# Patient Record
Sex: Female | Born: 1990 | Race: White | Hispanic: No | Marital: Single | Smoking: Current every day smoker
Health system: Southern US, Community
[De-identification: ages and names within clinical notes are randomized; demographics above are authoritative.]

---
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
## 2012-11-28 ENCOUNTER — Emergency Department (HOSPITAL_COMMUNITY): Payer: No Typology Code available for payment source

## 2012-11-28 ENCOUNTER — Emergency Department (HOSPITAL_COMMUNITY)
Admission: EM | Admit: 2012-11-28 | Discharge: 2012-11-28 | Disposition: A | Discharge status: Home / Self Care | Payer: No Typology Code available for payment source | Attending: Emergency Medicine | Admitting: Emergency Medicine

## 2012-11-28 ENCOUNTER — Encounter (HOSPITAL_COMMUNITY): Payer: Self-pay | Admitting: Emergency Medicine

## 2012-11-28 DIAGNOSIS — Z87828 Personal history of other (healed) physical injury and trauma: Secondary | ICD-10-CM | POA: Insufficient documentation

## 2012-11-28 DIAGNOSIS — T733XXA Exhaustion due to excessive exertion, initial encounter: Secondary | ICD-10-CM | POA: Insufficient documentation

## 2012-11-28 DIAGNOSIS — S335XXA Sprain of ligaments of lumbar spine, initial encounter: Secondary | ICD-10-CM | POA: Insufficient documentation

## 2012-11-28 DIAGNOSIS — F17200 Nicotine dependence, unspecified, uncomplicated: Secondary | ICD-10-CM | POA: Insufficient documentation

## 2012-11-28 DIAGNOSIS — Y9229 Other specified public building as the place of occurrence of the external cause: Secondary | ICD-10-CM | POA: Insufficient documentation

## 2012-11-28 DIAGNOSIS — Y9389 Activity, other specified: Secondary | ICD-10-CM | POA: Insufficient documentation

## 2012-11-28 DIAGNOSIS — S39012A Strain of muscle, fascia and tendon of lower back, initial encounter: Secondary | ICD-10-CM

## 2012-11-28 MED ORDER — CYCLOBENZAPRINE HCL 10 MG PO TABS: 10.0000 mg | ORAL_TABLET | Freq: Three times a day (TID) | ORAL | Status: AC | PRN

## 2012-11-28 MED ORDER — HYDROCODONE-ACETAMINOPHEN 5-325 MG PO TABS: 1.0000 | ORAL_TABLET | Freq: Four times a day (QID) | ORAL | Status: AC | PRN

## 2012-11-28 MED ORDER — PREDNISONE 50 MG PO TABS: 50.0000 mg | ORAL_TABLET | Freq: Every day | ORAL | Status: AC
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
## 2012-11-30 NOTE — ED Provider Notes (Signed)
Medical screening examination/treatment/procedure(s) were performed by non-physician practitioner and as supervising physician I was immediately available for consultation/collaboration.   Rolan Bucco, MD 11/30/12 [phone]

## 2013-01-02 ENCOUNTER — Ambulatory Visit: Payer: No Typology Code available for payment source | Attending: Orthopedic Surgery | Admitting: Physical Therapy

## 2013-01-02 DIAGNOSIS — IMO0001 Reserved for inherently not codable concepts without codable children: Secondary | ICD-10-CM | POA: Insufficient documentation

## 2013-01-02 DIAGNOSIS — M542 Cervicalgia: Secondary | ICD-10-CM | POA: Insufficient documentation

## 2013-01-02 DIAGNOSIS — M545 Low back pain, unspecified: Secondary | ICD-10-CM | POA: Insufficient documentation

## 2013-01-02 DIAGNOSIS — M546 Pain in thoracic spine: Secondary | ICD-10-CM | POA: Insufficient documentation

## 2013-01-06 ENCOUNTER — Ambulatory Visit: Payer: No Typology Code available for payment source | Attending: Orthopedic Surgery | Admitting: Physical Therapy

## 2013-01-06 DIAGNOSIS — M545 Low back pain, unspecified: Secondary | ICD-10-CM | POA: Insufficient documentation

## 2013-01-06 DIAGNOSIS — IMO0001 Reserved for inherently not codable concepts without codable children: Secondary | ICD-10-CM | POA: Insufficient documentation

## 2013-01-06 DIAGNOSIS — M546 Pain in thoracic spine: Secondary | ICD-10-CM | POA: Insufficient documentation

## 2013-01-06 DIAGNOSIS — M542 Cervicalgia: Secondary | ICD-10-CM | POA: Insufficient documentation

## 2013-01-08 ENCOUNTER — Ambulatory Visit: Payer: No Typology Code available for payment source | Admitting: Physical Therapy

## 2013-01-28 ENCOUNTER — Ambulatory Visit: Payer: No Typology Code available for payment source | Admitting: Physical Therapy

## 2013-01-30 ENCOUNTER — Ambulatory Visit: Payer: No Typology Code available for payment source | Admitting: Physical Therapy

## 2014-09-16 ENCOUNTER — Encounter (HOSPITAL_COMMUNITY): Payer: Self-pay | Admitting: Emergency Medicine

## 2014-09-16 ENCOUNTER — Emergency Department (HOSPITAL_COMMUNITY)
Admission: EM | Admit: 2014-09-16 | Discharge: 2014-09-16 | Disposition: A | Discharge status: Home / Self Care | Payer: No Typology Code available for payment source | Attending: Emergency Medicine | Admitting: Emergency Medicine

## 2014-09-16 DIAGNOSIS — Y92410 Unspecified street and highway as the place of occurrence of the external cause: Secondary | ICD-10-CM | POA: Insufficient documentation

## 2014-09-16 DIAGNOSIS — Y998 Other external cause status: Secondary | ICD-10-CM | POA: Insufficient documentation

## 2014-09-16 DIAGNOSIS — Z7952 Long term (current) use of systemic steroids: Secondary | ICD-10-CM | POA: Insufficient documentation

## 2014-09-16 DIAGNOSIS — Z720 Tobacco use: Secondary | ICD-10-CM | POA: Insufficient documentation

## 2014-09-16 DIAGNOSIS — Z79899 Other long term (current) drug therapy: Secondary | ICD-10-CM | POA: Insufficient documentation

## 2014-09-16 DIAGNOSIS — S299XXA Unspecified injury of thorax, initial encounter: Secondary | ICD-10-CM | POA: Insufficient documentation

## 2014-09-16 DIAGNOSIS — R0781 Pleurodynia: Secondary | ICD-10-CM

## 2014-09-16 DIAGNOSIS — Y9389 Activity, other specified: Secondary | ICD-10-CM | POA: Insufficient documentation

## 2014-09-16 MED ORDER — IBUPROFEN 600 MG PO TABS: 600.0000 mg | ORAL_TABLET | Freq: Four times a day (QID) | ORAL | Status: AC | PRN

## 2014-09-16 MED ORDER — TRAMADOL HCL 50 MG PO TABS: 50.0000 mg | ORAL_TABLET | Freq: Four times a day (QID) | ORAL | Status: AC | PRN
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
## 2014-09-16 NOTE — Discharge Instructions (Signed)
Chest Wall Pain °Chest wall pain is pain in or around the bones and muscles of your chest. It may take up to 6 weeks to get better. It may take longer if you must stay physically active in your work and activities.  °CAUSES  °Chest wall pain may happen on its own. However, it may be caused by: °· A viral illness like the flu. °· Injury. °· Coughing. °· Exercise. °· Arthritis. °· Fibromyalgia. °· Shingles. °HOME CARE INSTRUCTIONS  °· Avoid overtiring physical activity. Try not to strain or perform activities that cause pain. This includes any activities using your chest or your abdominal and side muscles, especially if heavy weights are used. °· Put ice on the sore area. °¨ Put ice in a plastic bag. °¨ Place a towel between your skin and the bag. °¨ Leave the ice on for 15-20 minutes per hour while awake for the first 2 days. °· Only take over-the-counter or prescription medicines for pain, discomfort, or fever as directed by your caregiver. °SEEK IMMEDIATE MEDICAL CARE IF:  °· Your pain increases, or you are very uncomfortable. °· You have a fever. °· Your chest pain becomes worse. °· You have new, unexplained symptoms. °· You have nausea or vomiting. °· You feel sweaty or lightheaded. °· You have a cough with phlegm (sputum), or you cough up blood. °MAKE SURE YOU:  °· Understand these instructions. °· Will watch your condition. °· Will get help right away if you are not doing well or get worse. °Document Released: 10/23/2005 Document Revised: 01/15/2012 Document Reviewed: 06/19/2011 °ExitCare® Patient Information ©2015 [employer]. This information is not intended to replace advice given to you by your health care provider. Make sure you discuss any questions you have with your health care provider. ° ° °Emergency Department Resource Guide °1) Find a Doctor and Pay Out of Pocket °Although you won't have to find out who is covered by your insurance plan, it is a good idea to ask around and get recommendations. You  will then need to call the office and see if the doctor you have chosen will accept you as a new patient and what types of options they offer for patients who are self-pay. Some doctors offer discounts or will set up payment plans for their patients who do not have insurance, but you will need to ask so you aren't surprised when you get to your appointment. ° °2) Contact Your Local Health Department °Not all health departments have doctors that can see patients for sick visits, but many do, so it is worth a call to see if yours does. If you don't know where your local health department is, you can check in your phone book. The CDC also has a tool to help you locate your state's health department, and many state websites also have listings of all of their local health departments. ° °3) Find a Walk-in Clinic °If your illness is not likely to be very severe or complicated, you may want to try a walk in clinic. These are popping up all over the country in pharmacies, drugstores, and shopping centers. They're usually staffed by nurse practitioners or physician assistants that have been trained to treat common illnesses and complaints. They're usually fairly quick and inexpensive. However, if you have serious medical issues or chronic medical problems, these are probably not your best option. ° °No Primary Care Doctor: °- Call [facility] at  832-8000 - they can help you locate a primary care doctor that    accepts your insurance, provides certain services, etc. °- Physician Referral Service- 1-800-533-3463 ° °Chronic Pain Problems: °Organization         Address  Phone   Notes  °[facility]  (336) 297-2271 Patients need to be referred by their primary care doctor.  ° °Medication Assistance: °Organization         Address  Phone   Notes  °[place] [facility] [address] °[place], [place] [address] (336) 641-8030 --Must be a resident of [place] °-- Must  have NO insurance coverage whatsoever (no Medicaid/ Medicare, etc.) °-- The pt. MUST have a primary care doctor that directs their care regularly and follows them in the community °  °[facility]  (866) 331-1348   °[employer]  (888) 892-1162   ° °Agencies that provide inexpensive medical care: °Organization         Address  Phone   Notes  °[facility]  (336) 832-8035   °[facility]    (336) 832-7272   °[facility] [address] °[place], [place] [address] (336) 832-4777   °[facility] [address], °[place] (336) 271-4999   °[facility]    (336) 373-0678   °[facility]    (336) 272-1050   °[facility] ° [address], [place] Phone:  (336) 832-4444, Fax:  (336) 832-4440 Hours of Operation:  9 am - 6 pm, M-F.  Also accepts Medicaid/Medicare and self-pay.  °[facility] ° [address], [place] Phone: (336) 832-3150, Fax: (336) [phone]. Hours of Operation:  8:30 am - 5:30 pm, M-F.  Also accepts Medicaid and self-pay.  °[facility] [place] [address], [place] Phone: (336) 878-6027   °[facility] [address], [place], [place] (336)723-1848, Ext. 123 Mondays & Thursdays: 7-9 AM.  First 15 patients are seen on a first come, first serve basis. °  ° °Medicaid-accepting [place] Providers: ° °Organization         Address  Phone   Notes  °[facility] [address], [place] (336) 641-2100 Also accepts self-pay patients.  °[facility] [address], [place] ° (336) 856-9996   °[facility] [address], [place] (336) 288-8857   °[facility] [address] [place] [address], [place] (336) 299-7000   °Veita Bland [address], [place]  ° (336) 373-1557 Only accepts [place] Access Medicaid patients after  they have their name applied to their card.  ° °Self-Pay (no insurance) in [place]: ° °Organization         Address  Phone   Notes  °Sickle Cell Patients, [facility] [address], [place] (336) 832-1970   °[facility] [address], [place] (336) 832-4400   °[facility] ° [address], [place] (336) 992-4800   °[facility]/Dr. Osei-Bonsu ° [address] [place] [address], [place] or [address], [place] (336) 841-8500 Phone number for both [place] and [place] locations is the same.  °[facility] [address], [place] (336) 299-0000   °[facility]  [address] [place] [address],  or [address] (336) 852-7530 °(336) 878-2260   °[facility] [address], [place] (336) 350-1642, phone; (336) 294-5005, fax Sees patients 1st and 3rd Saturday of every month.  Must not qualify for public or private insurance (i.e. Medicaid, Medicare, [place] Health Choice, Veterans' Benefits) • Household income should be no more than 200% of the poverty level •The clinic cannot treat you if you are pregnant or think you are pregnant • Sexually transmitted diseases are not treated at the clinic.  ° ° °Dental Care: °Organization         Address  Phone  Notes  °[facility] [address], [place] (336) 641-6152 Accepts children up to age 21 who are enrolled in Medicaid or [place] Health Choice; pregnant women with a Medicaid card; and children who have applied for Medicaid or [place] Health Choice, but were declined, whose parents can pay a reduced fee at time of service.  °[facility] [place]  [address], [place] (336) 641-7733 Accepts children up to age 21 who are enrolled in Medicaid or [place] Health Choice; pregnant women with a Medicaid card; and children who  have applied for Medicaid or [place] Health Choice, but were declined, whose parents can pay a reduced fee at time of service.  °[facility] ° [address], [place] (336) 641-4533 Patients are seen by appointment only. Walk-ins are not accepted. [facility] will see patients 18 years of age and older. °Monday - Tuesday (8am-5pm) °Most Wednesdays (8:30-5pm) °$30 per visit, cash only  °[facility] ° [address], [place] (336) 641-4533 Patients are seen by appointment only. Walk-ins are not accepted. [facility] will see patients 18 years of age and older. °One Wednesday Evening (Monthly: Volunteer Based).  $30 per visit, cash only  °[facility]  (919) 537-3737 for adults; Children under age 4, call Graduate Pediatric Dentistry at ([phone]) [phone]. Children aged 4-14, please call ([phone]) [phone] to request a pediatric application. ° Dental services are provided in all areas of dental care including fillings, crowns and bridges, complete and partial dentures, implants, gum treatment, root canals, and extractions. Preventive care is also provided. Treatment is provided to both adults and children. °Patients are selected via a lottery and there is often a waiting list. °  °[facility] [address], °[place] ° (336) 763-8833 [email or web address] °  °[facility] [address], [place], [place] (336)723-1848, Ext. 123 Second and Fourth Thursday of each month, opens at 6:30 AM; Clinic ends at 9 AM.  Patients are seen on a first-come first-served basis, and a limited number are seen during each clinic.  ° °[facility] ° [address], [place], [place] (336) 723-7904   Eligibility Requirements °You must have lived in [place], [place], or [place] counties for at least the last three months. °  You cannot be eligible for state or federal sponsored healthcare insurance, including Veterans  Administration, Medicaid, or Medicare. °  You generally cannot be eligible for healthcare insurance through your employer.  °  How to apply: °Eligibility screenings are held every Tuesday and Wednesday afternoon from 1:00 pm until 4:00 pm. You do not need an appointment for the interview!  °[facility] [address], [place], [place] 336-631-2330   °[facility]  336-342-8273   °[facility]  336-703-3100   °[facility]    336-570-6415   ° °Behavioral Health Resources in the Community: °Intensive Outpatient Programs °Organization         Address  Phone  Notes  °[facility] [address], [place], [place] 336-878-6098   °[facility] [address], [place], [place] 336-832-9800   °[facility] [address], [place], [place] ° 336-882-2125   °[facility] [address],  °[place], [place] 1-800-853-5163 or 336-641-4981   °Substance Abuse Resources °Organization         Address  Phone  Notes  °[facility]  336-882-2125   °[facility]  336-784-9470   °[facility]  336-285-9073   °[facility]  336-845-3988   °Residential & Outpatient Substance Abuse Program  1-800-659-3381   °Psychological Services °Organization         Address  Phone  Notes  °[facility]  336- 832-9600   °[facility]  336- 378-7881   °[facility] [address], [place] 1-800-853-5163 or 336-641-4981   ° °Mobile Crisis Teams °Organization         Address  Phone  Notes  °[facility], Mobile Crisis Care Unit  1-877-626-1772   °Assertive °[facility] ° [address] [place], [place] 336-834-9664   °Sharon DeEsch [address] °[place] 336-554-5454   ° °Self-Help/Support Groups °Organization         Address  Phone             Notes  °[facility] -  variety of support groups  336- 373-1402 Call for more information  °Narcotics Anonymous (NA), [facility] [address], °[place]  2 meetings at this location  ° °Residential Treatment Programs °Organization         Address  Phone  Notes  °[facility] [address],    °[place]  1-866-801-8205   °[facility] ° [address], [place], [place] 704-293-8524   °[facility] [address], [place] 336-845-3988 Admissions: 8am-3pm M-F  °[facility] [address]    °[place], [place] 336-841-1104   °[facility] [address], [place], [place] 336-379-7146   °[facility] [address]  °[place], [place] 336-285-9073   °[facility] - Intensive Outpatient [address], [place], [place] 336-852-3033   °[facility] [address]  °[place], [place] 1-877-615-2722 or 336-784-9470   °[facility] [address] [place], [place] 336-227-7417 Accepts Medicaid  °[facility] [address]  ° [place] 1-800-659-3381 Substance Abuse/Addiction Treatment  ° °[place] Behavioral Health Resources °Organization         Address  Phone  Notes  °[facility]  (888) 581-9988   °Julie Brannon, PhD [address] [place], [place]   (336) 349-5553 or (336) 951-0000   ° [facility]al   [address] °[place], [place] (336) 349-4454   °[facility] [address], [place], [place] (336) 342-8316 Insurance/Medicaid/sponsorship through [facility]  °[facility] [address]                                    [place],  (336) 342-8316 Therapy/tele-psych/case  °[facility] [address]  ° [place], [place] (336) 349-2233    °Dr. Arfeen  (336) 349-4544   °[facility] [place]  [employer] [place] [facility] 1) [address], [place] °2) [address], [place] °3)  [address], [place] (336) 349-3220 °(336) 342-7768 ° °(336) 342-8140   °[place] [facility] (336) 342-1394 or (336) 342-3537 (After Hours)    ° ° ° °

## 2014-12-17 ENCOUNTER — Ambulatory Visit: Payer: BLUE CROSS/BLUE SHIELD
